# Patient Record
Sex: Male | Born: 1935 | Race: White | Hispanic: No
Health system: Southern US, Community
[De-identification: ages and names within clinical notes are randomized; demographics above are authoritative.]

---

## 2011-12-08 ENCOUNTER — Emergency Department: Payer: Self-pay | Admitting: *Deleted

## 2011-12-08 LAB — URINALYSIS, COMPLETE
Bacteria: NONE SEEN
Bilirubin,UR: NEGATIVE
Blood: NEGATIVE
Glucose,UR: 150 mg/dL (ref 0–75)
Ketone: NEGATIVE
Nitrite: NEGATIVE
Specific Gravity: 1.017 (ref 1.003–1.030)
Squamous Epithelial: NONE SEEN
WBC UR: 1 /HPF (ref 0–5)

## 2011-12-08 LAB — CBC
HGB: 14.7 g/dL (ref 13.0–18.0)
MCH: 32.5 pg (ref 26.0–34.0)
MCHC: 35.1 g/dL (ref 32.0–36.0)
MCV: 93 fL (ref 80–100)
Platelet: 169 10*3/uL (ref 150–440)
RBC: 4.52 10*6/uL (ref 4.40–5.90)
WBC: 8.6 10*3/uL (ref 3.8–10.6)

## 2011-12-08 LAB — COMPREHENSIVE METABOLIC PANEL
Albumin: 4 g/dL (ref 3.4–5.0)
Alkaline Phosphatase: 81 U/L (ref 50–136)
BUN: 19 mg/dL — ABNORMAL HIGH (ref 7–18)
Chloride: 101 mmol/L (ref 98–107)
EGFR (African American): 45 — ABNORMAL LOW
Glucose: 219 mg/dL — ABNORMAL HIGH (ref 65–99)
Potassium: 4.2 mmol/L (ref 3.5–5.1)
SGOT(AST): 20 U/L (ref 15–37)
SGPT (ALT): 28 U/L (ref 12–78)
Total Protein: 7.6 g/dL (ref 6.4–8.2)

## 2011-12-08 LAB — TROPONIN I: Troponin-I: 0.02 ng/mL

## 2013-09-15 ENCOUNTER — Inpatient Hospital Stay: Payer: Self-pay | Admitting: Internal Medicine

## 2013-09-15 LAB — TROPONIN I
TROPONIN-I: 0.24 ng/mL — AB
Troponin-I: 0.88 ng/mL — ABNORMAL HIGH
Troponin-I: 0.86 ng/mL — ABNORMAL HIGH

## 2013-09-15 LAB — HEPATIC FUNCTION PANEL A (ARMC)
AST: 35 U/L (ref 15–37)
Albumin: 3.3 g/dL — ABNORMAL LOW (ref 3.4–5.0)
Alkaline Phosphatase: 87 U/L
Bilirubin, Direct: 0.2 mg/dL (ref 0.00–0.20)
Bilirubin,Total: 1 mg/dL (ref 0.2–1.0)
SGPT (ALT): 28 U/L (ref 12–78)
TOTAL PROTEIN: 7.3 g/dL (ref 6.4–8.2)

## 2013-09-15 LAB — CBC
HCT: 45.2 % (ref 40.0–52.0)
HGB: 13.6 g/dL (ref 13.0–18.0)
MCH: 28 pg (ref 26.0–34.0)
MCHC: 30.1 g/dL — ABNORMAL LOW (ref 32.0–36.0)
MCV: 93 fL (ref 80–100)
Platelet: 225 10*3/uL (ref 150–440)
RBC: 4.87 10*6/uL (ref 4.40–5.90)
RDW: 17.2 % — ABNORMAL HIGH (ref 11.5–14.5)
WBC: 20.5 10*3/uL — ABNORMAL HIGH (ref 3.8–10.6)

## 2013-09-15 LAB — BASIC METABOLIC PANEL
ANION GAP: 8 (ref 7–16)
BUN: 36 mg/dL — ABNORMAL HIGH (ref 7–18)
CALCIUM: 8.2 mg/dL — AB (ref 8.5–10.1)
CHLORIDE: 108 mmol/L — AB (ref 98–107)
CO2: 25 mmol/L (ref 21–32)
Creatinine: 2.03 mg/dL — ABNORMAL HIGH (ref 0.60–1.30)
EGFR (African American): 32 — ABNORMAL LOW
EGFR (Non-African Amer.): 27 — ABNORMAL LOW
Glucose: 98 mg/dL (ref 65–99)
Osmolality: 290 (ref 275–301)
POTASSIUM: 4.5 mmol/L (ref 3.5–5.1)
SODIUM: 141 mmol/L (ref 136–145)

## 2013-09-15 LAB — CK-MB
CK-MB: 4.8 ng/mL — ABNORMAL HIGH (ref 0.5–3.6)
CK-MB: 3.2 ng/mL (ref 0.5–3.6)

## 2013-09-16 ENCOUNTER — Ambulatory Visit: Payer: Self-pay | Admitting: Internal Medicine

## 2013-09-16 LAB — TROPONIN I: Troponin-I: 0.72 ng/mL — ABNORMAL HIGH

## 2013-09-16 LAB — COMPREHENSIVE METABOLIC PANEL
ALT: 21 U/L (ref 12–78)
Albumin: 2.4 g/dL — ABNORMAL LOW (ref 3.4–5.0)
Alkaline Phosphatase: 62 U/L
Anion Gap: 6 — ABNORMAL LOW (ref 7–16)
BUN: 39 mg/dL — AB (ref 7–18)
Bilirubin,Total: 1.1 mg/dL — ABNORMAL HIGH (ref 0.2–1.0)
CALCIUM: 8.1 mg/dL — AB (ref 8.5–10.1)
CREATININE: 2.56 mg/dL — AB (ref 0.60–1.30)
Chloride: 111 mmol/L — ABNORMAL HIGH (ref 98–107)
Co2: 25 mmol/L (ref 21–32)
GFR CALC AF AMER: 24 — AB
GFR CALC NON AF AMER: 21 — AB
Glucose: 165 mg/dL — ABNORMAL HIGH (ref 65–99)
Osmolality: 296 (ref 275–301)
POTASSIUM: 4.1 mmol/L (ref 3.5–5.1)
SGOT(AST): 26 U/L (ref 15–37)
Sodium: 142 mmol/L (ref 136–145)
Total Protein: 5.6 g/dL — ABNORMAL LOW (ref 6.4–8.2)

## 2013-09-16 LAB — CBC WITH DIFFERENTIAL/PLATELET
Basophil #: 0.1 10*3/uL (ref 0.0–0.1)
Basophil %: 0.5 %
EOS PCT: 0 %
Eosinophil #: 0 10*3/uL (ref 0.0–0.7)
HCT: 36.3 % — AB (ref 40.0–52.0)
HGB: 11.4 g/dL — ABNORMAL LOW (ref 13.0–18.0)
LYMPHS ABS: 1 10*3/uL (ref 1.0–3.6)
LYMPHS PCT: 8.5 %
MCH: 28.4 pg (ref 26.0–34.0)
MCHC: 31.4 g/dL — AB (ref 32.0–36.0)
MCV: 90 fL (ref 80–100)
MONO ABS: 0.2 x10 3/mm (ref 0.2–1.0)
MONOS PCT: 1.8 %
Neutrophil #: 10.5 10*3/uL — ABNORMAL HIGH (ref 1.4–6.5)
Neutrophil %: 89.2 %
PLATELETS: 196 10*3/uL (ref 150–440)
RBC: 4.02 10*6/uL — ABNORMAL LOW (ref 4.40–5.90)
RDW: 16.5 % — ABNORMAL HIGH (ref 11.5–14.5)
WBC: 11.7 10*3/uL — ABNORMAL HIGH (ref 3.8–10.6)

## 2013-09-16 LAB — CK-MB: CK-MB: 2.8 ng/mL (ref 0.5–3.6)

## 2013-09-17 LAB — BASIC METABOLIC PANEL
Anion Gap: 12 (ref 7–16)
BUN: 67 mg/dL — ABNORMAL HIGH (ref 7–18)
Calcium, Total: 8.5 mg/dL (ref 8.5–10.1)
Chloride: 105 mmol/L (ref 98–107)
Co2: 22 mmol/L (ref 21–32)
Creatinine: 3.32 mg/dL — ABNORMAL HIGH (ref 0.60–1.30)
EGFR (African American): 18 — ABNORMAL LOW
EGFR (Non-African Amer.): 15 — ABNORMAL LOW
Glucose: 308 mg/dL — ABNORMAL HIGH (ref 65–99)
Osmolality: 309 (ref 275–301)
POTASSIUM: 4.3 mmol/L (ref 3.5–5.1)
Sodium: 139 mmol/L (ref 136–145)

## 2013-09-17 LAB — CBC WITH DIFFERENTIAL/PLATELET
BASOS PCT: 0.5 %
Basophil #: 0.1 10*3/uL (ref 0.0–0.1)
Eosinophil #: 0 10*3/uL (ref 0.0–0.7)
Eosinophil %: 0 %
HCT: 41.8 % (ref 40.0–52.0)
HGB: 13.1 g/dL (ref 13.0–18.0)
LYMPHS PCT: 7.6 %
Lymphocyte #: 0.9 10*3/uL — ABNORMAL LOW (ref 1.0–3.6)
MCH: 28.4 pg (ref 26.0–34.0)
MCHC: 31.4 g/dL — ABNORMAL LOW (ref 32.0–36.0)
MCV: 91 fL (ref 80–100)
MONOS PCT: 5.4 %
Monocyte #: 0.7 x10 3/mm (ref 0.2–1.0)
NEUTROS PCT: 86.5 %
Neutrophil #: 10.8 10*3/uL — ABNORMAL HIGH (ref 1.4–6.5)
PLATELETS: 213 10*3/uL (ref 150–440)
RBC: 4.61 10*6/uL (ref 4.40–5.90)
RDW: 17.5 % — ABNORMAL HIGH (ref 11.5–14.5)
WBC: 12.5 10*3/uL — ABNORMAL HIGH (ref 3.8–10.6)

## 2013-09-17 LAB — MAGNESIUM: Magnesium: 2.3 mg/dL

## 2013-09-17 LAB — HEMOGLOBIN A1C: HEMOGLOBIN A1C: 5.8 % (ref 4.2–6.3)

## 2013-09-18 LAB — BASIC METABOLIC PANEL
ANION GAP: 9 (ref 7–16)
BUN: 87 mg/dL — AB (ref 7–18)
CALCIUM: 8.3 mg/dL — AB (ref 8.5–10.1)
Chloride: 109 mmol/L — ABNORMAL HIGH (ref 98–107)
Co2: 25 mmol/L (ref 21–32)
Creatinine: 3.36 mg/dL — ABNORMAL HIGH (ref 0.60–1.30)
EGFR (African American): 17 — ABNORMAL LOW
EGFR (Non-African Amer.): 15 — ABNORMAL LOW
GLUCOSE: 295 mg/dL — AB (ref 65–99)
OSMOLALITY: 322 (ref 275–301)
Potassium: 4 mmol/L (ref 3.5–5.1)
Sodium: 143 mmol/L (ref 136–145)

## 2013-09-19 LAB — BASIC METABOLIC PANEL
Anion Gap: 8 (ref 7–16)
BUN: 89 mg/dL — ABNORMAL HIGH (ref 7–18)
CHLORIDE: 112 mmol/L — AB (ref 98–107)
CREATININE: 3.09 mg/dL — AB (ref 0.60–1.30)
Calcium, Total: 8.7 mg/dL (ref 8.5–10.1)
Co2: 27 mmol/L (ref 21–32)
EGFR (African American): 19 — ABNORMAL LOW
EGFR (Non-African Amer.): 17 — ABNORMAL LOW
GLUCOSE: 490 mg/dL — AB (ref 65–99)
OSMOLALITY: 341 (ref 275–301)
Potassium: 4.2 mmol/L (ref 3.5–5.1)
Sodium: 147 mmol/L — ABNORMAL HIGH (ref 136–145)

## 2013-09-20 LAB — CULTURE, BLOOD (SINGLE)

## 2013-10-16 ENCOUNTER — Ambulatory Visit: Payer: Self-pay | Admitting: Internal Medicine

## 2013-10-16 DEATH — deceased

## 2014-08-09 NOTE — Discharge Summary (Signed)
PATIENT NAME:  Marcus Acosta, Marcus Acosta MR#:  [id number] DATE OF BIRTH:  [birth year]  DATE OF ADMISSION:  09/15/2013 DATE OF DISCHARGE:  09/19/2013  ADMITTING DIAGNOSIS: Shortness of breath.   DISCHARGE DIAGNOSES: 1. Acute respiratory failure due to atrial fibrillation as well as acute systolic congestive heart failure.  2. New-onset atrial fibrillation, not an anticoagulation candidate.  3. Elevated troponin, possibly due to non-ST myocardial infarction.  4. Possible aspiration pneumonia.  5. Diabetes.  6. Acute on chronic renal failure.  7. Advanced dementia.  8. History of coronary artery disease, status post angioplasty and stent placement.  9. Morbid obesity.  10. Hyperlipidemia.  11. Benign prostatic hypertrophy.   CONSULTANTS:  Dr. Mosetta PigeonHarmeet Singh, Dr. Meredeth IdeFleming.   LABORATORY DATA: Glucose 98, BUN 36, creatinine 2.03, sodium 141, potassium 4.5, chloride 108, CO2 25, calcium 8.2. LFTs showed albumin of 3.3. Troponin 0.24, then 0.88. WBC count on admission 20.5, hemoglobin 13.6, platelet count was 225.   Blood cultures: No growth.   Chest x-ray shows findings suggestive of pulmonary edema. Echocardiogram showed ejection fraction of 20% to 25%, mild to moderately increased left ventricular internal cavity size, moderately enlarged right ventricle, mildly dilated left atrium, moderately dilated right atrium, mild aortic regurg.   HOSPITAL COURSE: Please refer to H and P done by the admitting physician. The patient is a 81 year old white male with dementia, who is brought from home for respiratory failure. The patient initially was FULL CODE and was intubated due to acute respiratory failure and subsequently extubated. He was diagnosed with acute systolic congestive heart failure. Despite treatment for his congestive heart failure, he continued to show no improvement. He was continued on treatment with Lasix. His respiratory status did somewhat improve. However, his prognosis is very poor with  significant systolic dysfunction. He was also noticed to be in atrial fibrillation and also noticed to have an elevated troponin. The patient was continuedtreated. However, he has continued to not improve. He was seen in consultation by palliative care and he was made DO NOT RESUSCITATE. At this point, decision by the family is made to  take him to hospice home. At this time arrangements for hospice home were made.   DISCHARGE MEDICATIONS: Tylenol 650 suppository q.4 p.r.n., morphine 20 mg per mL 0.5 mL q.4 p.r.n. for pain, Ativan 1 mg t.i.d. as needed for agitation.   DISPOSITION: To hospice home.    TIME SPENT: 35 minutes.   ____________________________ Lacie ScottsShreyang H. Allena KatzPatel, MD shp:sg D: 09/19/2013 14:15:51 ET T: 09/19/2013 14:33:40 ET JOB#: [id number]  cc: Margueritte Guthridge H. Allena KatzPatel, MD, <Dictator> Charise CarwinSHREYANG H Jasmin Trumbull MD ELECTRONICALLY SIGNED 09/21/2013 14:32

## 2014-08-09 NOTE — H&P (Signed)
PATIENT NAME:  Marcus Acosta, Marcus Acosta MR#:  [id number] DATE OF BIRTH:  05-16-1917  DATE OF ADMISSION:  09/15/2013  REFERRING PHYSICIAN: Sheryl L. Mindi Junker, MD  FAMILY PHYSICIAN: [place].    REASON FOR ADMISSION: Acute respiratory failure.   HISTORY OF PRESENT ILLNESS: The patient is a 81 year old male followed by the [facility] with a history of coronary artery disease, diabetes and benign hypertension. He has been having problems with low blood sugars over the past 2 weeks.  He developed some respiratory issues approximately 2 to 3 days ago. Presents to the Emergency Room today with gurgling respirations and unresponsiveness. Was intubated in the Emergency Room. He is in atrial fibrillation which is apparently new according to family. He is now admitted for further evaluation. The patient's family states that he is a full code and want everything done for the patient at this time despite his age and serious illness.   PAST MEDICAL HISTORY: 1.  Atherosclerotic cardiovascular disease, status post percutaneous transluminal coronary angioplasty with stent placement.  2.  Type 2 diabetes.  3.  Benign hypertension.  4.  Obesity.  5.  Hyperlipidemia.  6.  Benign prostatic hypertrophy.   MEDICATIONS: 1.  Prazosin 5 mg p.o. at bedtime.  2.  Omeprazole 20 mg p.o. daily.  3.  Lisinopril 40 mg p.o. daily.  4.  Glipizide 2.5 mg p.o. daily.  5.  Proscar 5 mg p.o. daily.  6.  Aspirin 81 mg p.o. daily.  7.  Lipitor 80 mg p.o. at bedtime.   ALLERGIES: No known drug allergies.   SOCIAL HISTORY: Negative for alcohol or tobacco abuse.   FAMILY HISTORY: Noncontributory.   REVIEW OF SYSTEMS: Unable to obtain from the patient.   PHYSICAL EXAMINATION: GENERAL: The patient is critically ill-appearing, intubated, in moderate respiratory distress.  VITAL SIGNS: Remarkable for a blood pressure of 140/80 with a heart rate of 87, respiratory rate of 16, temperature of 97.7.  HEENT: Normocephalic, atraumatic. Pupils  equally round and reactive to light and accommodation. Extraocular movements are intact. Sclerae are anicteric. Conjunctivae are clear. Oropharynx is clear.  NECK: Supple with JVD noted to the angle of the jaw. No adenopathy or thyromegaly is present.  LUNGS: Reveal diffuse rhonchi, wheezes and rales. Dullness at the bases. Respiratory effort is increased.  CARDIAC: Irregularly irregular rhythm. No significant rubs or gallops. PMI is nondisplaced. Chest wall is nontender.  ABDOMEN: Soft, nontender, with normoactive bowel sounds. No organomegaly or masses were appreciated. No hernias or bruits were noted.  EXTREMITIES: Revealed 2+ edema, without clubbing or cyanosis. Pulses are 1+ bilaterally.  SKIN: Warm and dry without rash or lesions.  NEUROLOGIC: Cranial nerves II through XII grossly intact. Deep tendon reflexes were symmetric. Motor and sensory examination is nonfocal.  PSYCHIATRIC: Unable to be performed due to the patient's intubation.   LABORATORY, DIAGNOSTIC AND RADIOLOGICAL DATA:  EKG revealed atrial fibrillation with diffuse nonspecific ST-T wave changes. Chest x-ray revealed cardiomegaly with pulmonary edema, bilateral atelectasis is also present. His white count is 20.5 with a hemoglobin of 13.6. Glucose is 98 with a BUN of 36, creatinine 2.03, sodium 141, a potassium of 4.5 and a GFR of 27.   ASSESSMENT: 1.  Acute respiratory failure.  2.  New-onset atrial fibrillation.  3.  Acute congestive heart failure.  4.  Elevated troponin consistent with non-ST elevation myocardial infarction.  5.  Presumed aspiration pneumonia.  6.  Type 2 diabetes.   PLAN: The patient will be admitted to the Intensive Care  Unit on mechanical ventilation as a full code according to the family's wishes. He will be started on IV Lasix with IV steroids, IV antibiotics and subcutaneous heparin. Currently, his atrial fibrillation is rate controlled. We will follow serial cardiac enzymes and obtain an  echocardiogram. We will consult cardiology and pulmonology. Will get a palliative care consult in the morning. We will follow his sugars closely. Follow up labs and chest x-ray in the morning. Prognosis is poor. The family is aware. Further treatment and evaluation will depend upon the patient's progress.   TOTAL TIME SPENT ON THIS PATIENT: 50 minutes.    ____________________________ Marcus LopeJeffrey D. Judithann SheenSparks, MD jds:cs D: 09/15/2013 13:39:35 ET T: 09/15/2013 14:04:40 ET JOB#: [id number]  cc: Marcus LopeJeffrey D. Judithann SheenSparks, MD, <Dictator> JEFFREY Rodena Medin SPARKS MD ELECTRONICALLY SIGNED 09/15/2013 22:38

## 2014-10-04 IMAGING — CR DG CHEST 1V PORT
1 series · 1 of 1 positions shown · non-contrast
Comparison: 09/15/2013

CLINICAL DATA: Unresponsive, intubation

EXAM:
PORTABLE CHEST - 1 VIEW

[ap]
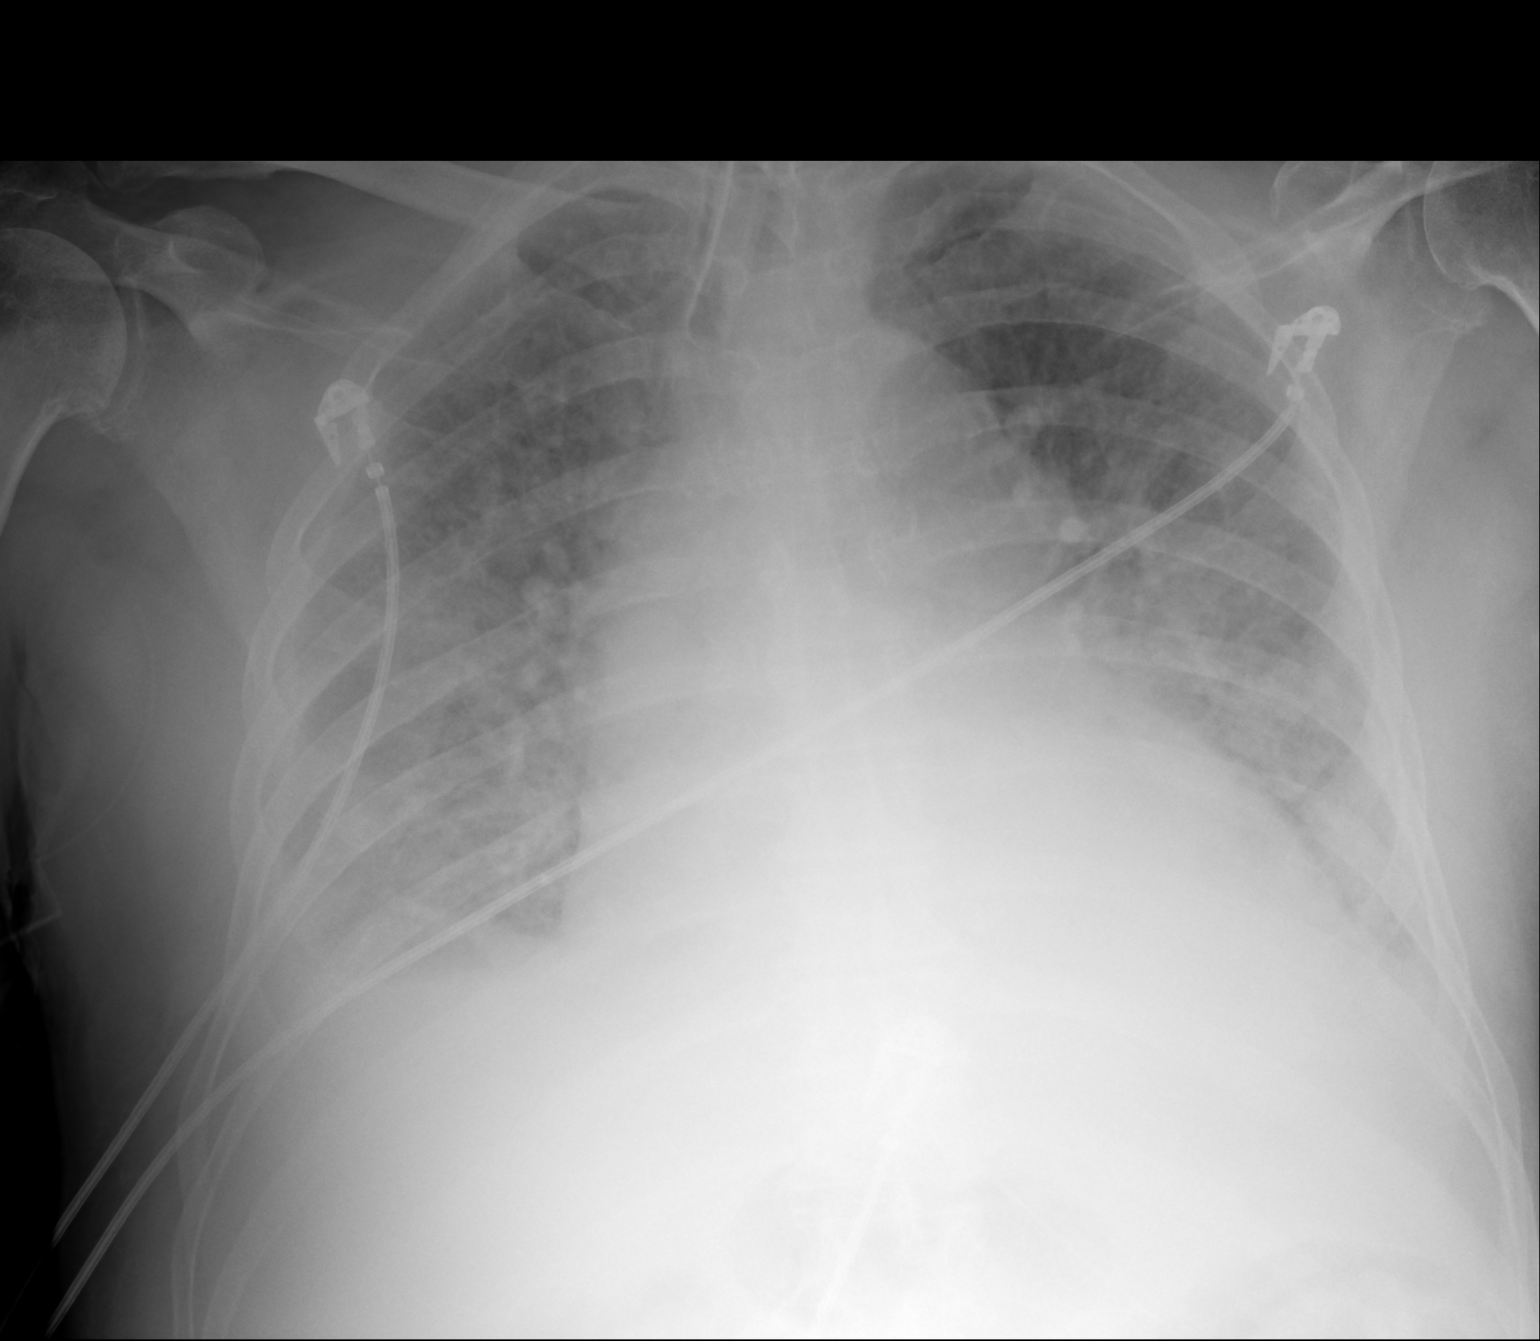

[1 of 1 positions shown; findings below may reference images not displayed]

FINDINGS: Endotracheal tube has been placed with tip about 4 cm above the
carinal. There is significant cardiac enlargement and severe
vascular congestion. Bilateral central lung hazy densities persist.
IMPRESSION: Findings suggest pulmonary edema related to congestive heart
failure. Endotracheal tube placed as described above.

## 2014-10-04 IMAGING — CR DG CHEST 1V PORT
1 series · 1 of 1 positions shown · non-contrast
Comparison: None.

CLINICAL DATA: Respiratory distress

EXAM:
PORTABLE CHEST - 1 VIEW

[ap]
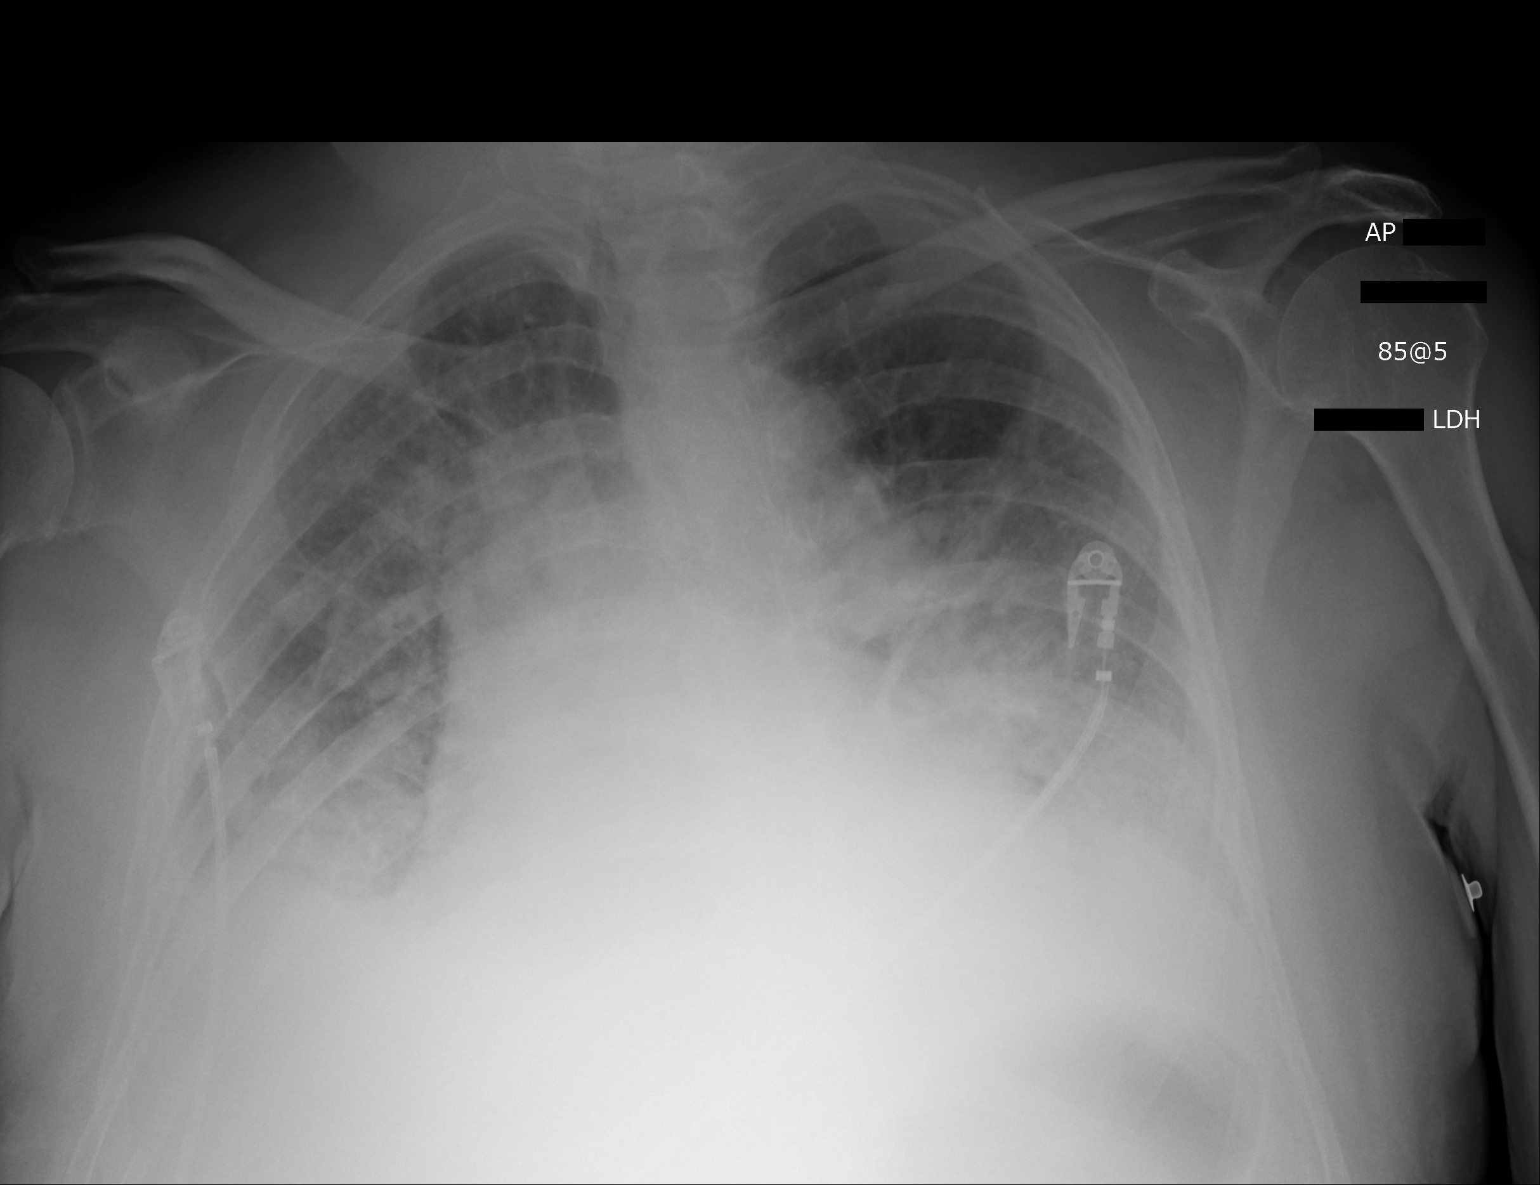

[1 of 1 positions shown; findings below may reference images not displayed]

FINDINGS: Cardiac shadow is enlarged. Diffuse vascular congestion is noted.
Small bilateral pleural effusions are seen. Bibasilar atelectatic
changes are seen as well. No bony abnormality is noted.
IMPRESSION: Bibasilar atelectatic changes as well as vascular congestion
consistent with CHF.

## 2014-10-05 IMAGING — CR DG CHEST 1V PORT
1 series · 1 of 1 positions shown · non-contrast
Comparison: July 16, 2013.

CLINICAL DATA: Respiratory failure.

EXAM:
PORTABLE CHEST - 1 VIEW

[ap]
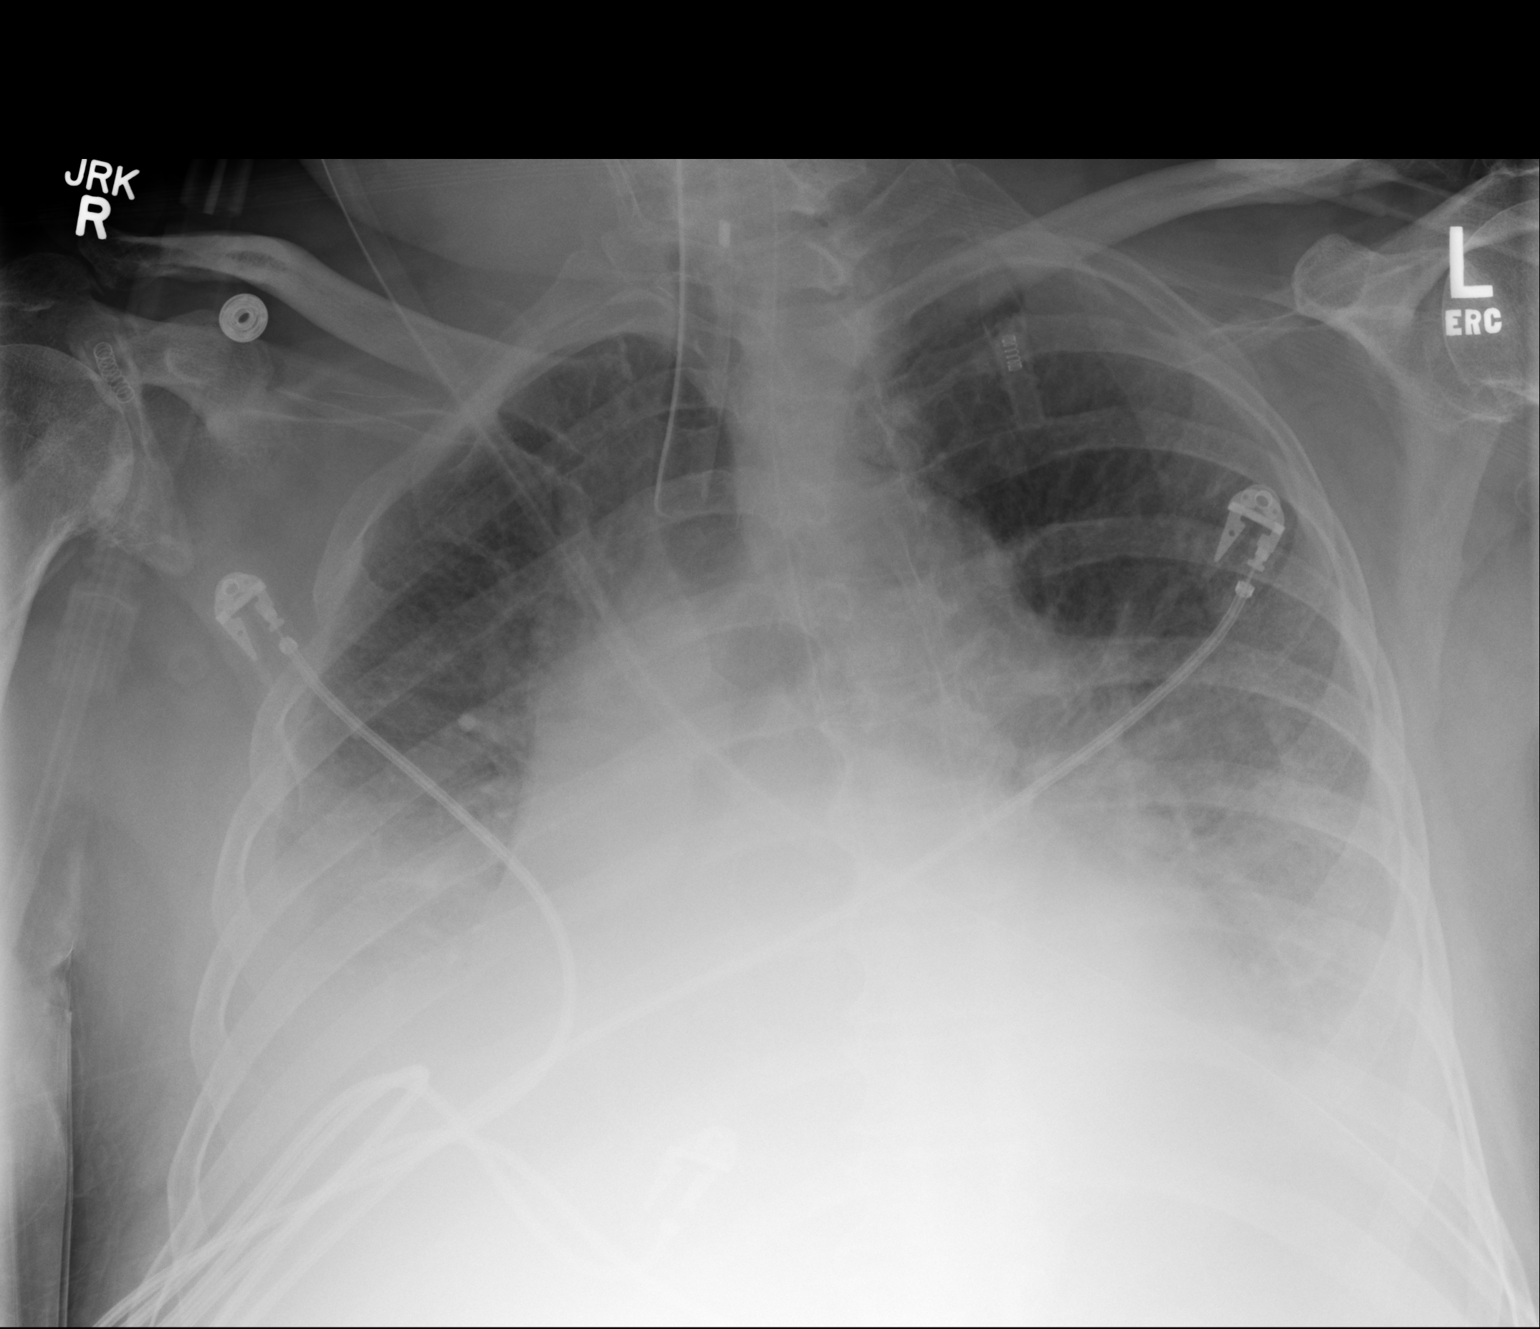

[1 of 1 positions shown; findings below may reference images not displayed]

FINDINGS: Stable cardiomegaly. Bilateral pleural effusions are noted which
appear to be increased compared to prior exam. Probable bilateral
perihilar and basilar pulmonary edema. No pneumothorax is noted.
Endotracheal tube is noted with tip 4.5 cm above the carina.
IMPRESSION: Endotracheal tube in grossly good position. Stable cardiomegaly with
continued presence of probable bilateral perihilar and basilar
pulmonary edema, with increased bilateral pleural effusions compared
to prior exam.
